# Patient Record
Sex: Male | Born: 1975 | Race: Asian | Hispanic: No | Marital: Single | Smoking: Never smoker
Health system: Southern US, Community
[De-identification: ages and names within clinical notes are randomized; demographics above are authoritative.]

---

## 2006-06-21 ENCOUNTER — Emergency Department (HOSPITAL_COMMUNITY): Admission: EM | Admit: 2006-06-21 | Discharge: 2006-06-21 | Payer: Self-pay | Admitting: Family Medicine

## 2006-08-12 ENCOUNTER — Emergency Department (HOSPITAL_COMMUNITY): Admission: EM | Admit: 2006-08-12 | Discharge: 2006-08-12 | Payer: Self-pay | Admitting: Family Medicine

## 2006-09-30 ENCOUNTER — Ambulatory Visit: Payer: Self-pay | Admitting: Internal Medicine

## 2009-05-22 ENCOUNTER — Encounter (INDEPENDENT_AMBULATORY_CARE_PROVIDER_SITE_OTHER): Payer: Self-pay | Admitting: Internal Medicine

## 2010-10-03 ENCOUNTER — Emergency Department (HOSPITAL_COMMUNITY)
Admission: EM | Admit: 2010-10-03 | Discharge: 2010-10-03 | Payer: Self-pay | Source: Home / Self Care | Admitting: Emergency Medicine

## 2018-12-28 ENCOUNTER — Emergency Department (HOSPITAL_COMMUNITY)
Admission: EM | Admit: 2018-12-28 | Discharge: 2018-12-28 | Disposition: A | Discharge status: Home / Self Care | Payer: Medicaid Other | Attending: Emergency Medicine | Admitting: Emergency Medicine

## 2018-12-28 ENCOUNTER — Emergency Department (HOSPITAL_COMMUNITY): Payer: Medicaid Other

## 2018-12-28 ENCOUNTER — Other Ambulatory Visit: Payer: Self-pay

## 2018-12-28 ENCOUNTER — Encounter (HOSPITAL_COMMUNITY): Payer: Self-pay | Admitting: Emergency Medicine

## 2018-12-28 DIAGNOSIS — J3489 Other specified disorders of nose and nasal sinuses: Secondary | ICD-10-CM | POA: Insufficient documentation

## 2018-12-28 DIAGNOSIS — R0602 Shortness of breath: Secondary | ICD-10-CM | POA: Diagnosis not present

## 2018-12-28 DIAGNOSIS — M791 Myalgia, unspecified site: Secondary | ICD-10-CM | POA: Insufficient documentation

## 2018-12-28 DIAGNOSIS — R1110 Vomiting, unspecified: Secondary | ICD-10-CM | POA: Diagnosis not present

## 2018-12-28 DIAGNOSIS — R509 Fever, unspecified: Secondary | ICD-10-CM | POA: Diagnosis not present

## 2018-12-28 DIAGNOSIS — Z20828 Contact with and (suspected) exposure to other viral communicable diseases: Secondary | ICD-10-CM | POA: Insufficient documentation

## 2018-12-28 DIAGNOSIS — Z20822 Contact with and (suspected) exposure to covid-19: Secondary | ICD-10-CM

## 2018-12-28 DIAGNOSIS — R630 Anorexia: Secondary | ICD-10-CM | POA: Diagnosis not present

## 2018-12-28 DIAGNOSIS — R05 Cough: Secondary | ICD-10-CM | POA: Diagnosis present

## 2018-12-28 DIAGNOSIS — R6889 Other general symptoms and signs: Secondary | ICD-10-CM

## 2018-12-28 LAB — BASIC METABOLIC PANEL
Anion gap: 8 (ref 5–15)
BUN: 13 mg/dL (ref 6–20)
CO2: 25 mmol/L (ref 22–32)
Calcium: 8.5 mg/dL — ABNORMAL LOW (ref 8.9–10.3)
Chloride: 106 mmol/L (ref 98–111)
Creatinine, Ser: 0.65 mg/dL (ref 0.61–1.24)
GFR calc Af Amer: >60 mL/min (ref >60)
GFR calc non Af Amer: >60 mL/min (ref >60)
Glucose, Bld: 92 mg/dL (ref 70–99)
Potassium: 3.8 mmol/L (ref 3.5–5.1)
Sodium: 139 mmol/L (ref 135–145)

## 2018-12-28 LAB — CBC
HCT: 41.7 % (ref 39.0–52.0)
Hemoglobin: 13.5 g/dL (ref 13.0–17.0)
MCH: 28.2 pg (ref 26.0–34.0)
MCHC: 32.4 g/dL (ref 30.0–36.0)
MCV: 87.1 fL (ref 80.0–100.0)
Platelets: 158 10*3/uL (ref 150–400)
RBC: 4.79 MIL/uL (ref 4.22–5.81)
RDW: 12.2 % (ref 11.5–15.5)
WBC: 4.9 10*3/uL (ref 4.0–10.5)
nRBC: 0 % (ref 0.0–0.2)

## 2018-12-28 MED ORDER — SODIUM CHLORIDE 0.9 % IV BOLUS (SEPSIS)
1000.0000 mL | Freq: Once | INTRAVENOUS | Status: AC
  Administered 2018-12-28: 1000 mL via INTRAVENOUS

## 2018-12-28 MED ORDER — BENZONATATE 100 MG PO CAPS: 100.0000 mg | ORAL_CAPSULE | Freq: Three times a day (TID) | ORAL | 0 refills | Status: AC

## 2018-12-28 MED ORDER — SODIUM CHLORIDE 0.9 % IV SOLN
1000.0000 mL | INTRAVENOUS | Status: DC
Start: 2018-12-28 — End: 2018-12-29

## 2018-12-28 NOTE — ED Notes (Signed)
[employer] reported that EMS was called to one of the addresses on file and is sending a unit out to see if a caregiver is available for the pt.

## 2018-12-28 NOTE — ED Notes (Signed)
Pt reported not having a way to get home. He said he doesn't know any phone numbers and does not have a cell phone with numbers to reference. I attempted to call both numbers on file for contacts and no one answered and I was unable to leave messages. Pt instructed on how to use the bus or taxi service to get home.

## 2018-12-28 NOTE — ED Notes (Signed)
Bed: PH15 Expected date:  Expected time:  Means of arrival:  Comments: EMS 42yo cough SHOB family r/o covid

## 2018-12-28 NOTE — ED Notes (Signed)
Pt given and verbalized understanding of d/c instructions and need for follow up with pcp. Told to self isolate for 2 weeks and return if s/s worsen. No further distress or questions at time of ambulation out of department.   Interpreter used for d/c instructions: [employer] [phone]

## 2018-12-28 NOTE — ED Notes (Signed)
Family present to pick patient up. D/c instructions re-verbalized to nephew that takes care of the pt, caregiver verbalized understanding of need to quarantine and monitor condition at home. Told to return if s/s worsen. No further distress or questions upon discharge with family.

## 2018-12-28 NOTE — ED Notes (Signed)
[employer] being contacted to look for family/caregiver for pt. Will go to house and assess situation to try and get pt home to the correct address.

## 2018-12-28 NOTE — ED Provider Notes (Signed)
[facility]ENCY [facility]PT Provider Note   CSN: [id number] Arrival date & time: 12/28/18  1643   Bradley Warner interpreter History   Chief Complaint Chief Complaint  Patient presents with  . Shortness of Breath  . Fever    HPI Bradley Warner is a 43 y.o. male.     HPI Sx started a week ago.  Pt has been coughing. Bringing up some mucus.   Pt did not have a thermometer but he felt feverish.   Feels short of breath. He has had a runny nose, body aches and appetite has been affected.   Food is tasteless and he has not been able to smell much.  He did vomit once, his stools have been loose but he takes stool softeners.   He has been taking tylenol and B1.  History reviewed. No pertinent past medical history.  There are no active problems to display for this patient.   History reviewed. No pertinent surgical history.      Home Medications    Prior to Admission medications   Not on File    Family History No family history on file.  Social History Social History   Tobacco Use  . Smoking status: Not on file  Substance Use Topics  . Alcohol use: Not on file  . Drug use: Not on file     Allergies   Patient has no allergy information on record.   Review of Systems Review of Systems  All other systems reviewed and are negative.    Physical Exam Updated Vital Signs BP 119/81   Pulse (!) 103   Temp 99.6 F (37.6 C) (Oral)   Resp (!) 26   SpO2 99%   Physical Exam Vitals signs and nursing note reviewed.  Constitutional:      General: He is not in acute distress.    Appearance: He is well-developed.  HENT:     Head: Normocephalic and atraumatic.     Right Ear: External ear normal.     Left Ear: External ear normal.  Eyes:     General: No scleral icterus.       Right eye: No discharge.        Left eye: No discharge.     Conjunctiva/sclera: Conjunctivae normal.  Neck:     Musculoskeletal: Neck supple.     Trachea: No tracheal deviation.   Cardiovascular:     Rate and Rhythm: Normal rate and regular rhythm.  Pulmonary:     Effort: Pulmonary effort is normal. No respiratory distress.     Breath sounds: No stridor. Examination of the right-lower field reveals rales. Examination of the left-lower field reveals rales. Rales present. No wheezing.  Abdominal:     General: Bowel sounds are normal. There is no distension.     Palpations: Abdomen is soft.     Tenderness: There is no abdominal tenderness. There is no guarding or rebound.  Musculoskeletal:        General: No tenderness.  Skin:    General: Skin is warm and dry.     Findings: No rash.  Neurological:     Mental Status: He is alert.     Cranial Nerves: No cranial nerve deficit (no facial droop, extraocular movements intact, no slurred speech).     Sensory: No sensory deficit.     Motor: No abnormal muscle tone or seizure activity.     Coordination: Coordination normal.      ED Treatments / Results  Labs (all labs  ordered are listed, but only abnormal results are displayed) Labs Reviewed  BASIC METABOLIC PANEL - Abnormal; Notable for the following components:      Result Value   Calcium 8.5 (*)    All other components within normal limits  CBC    EKG None  Radiology Dg Chest Portable 1 View  Result Date: 12/28/2018 CLINICAL DATA:  Shortness of breath with fever and cough for 1 week. COVID-19 exposure. EXAM: PORTABLE CHEST 1 VIEW COMPARISON:  None. FINDINGS: 1713 hours. There are low lung volumes. Allowing for this, the heart size and mediastinal contours are normal. There are patchy ground-glass and airspace opacities at both lung bases. There is no confluent airspace opacity, pneumothorax or significant pleural effusion. IMPRESSION: Patchy bibasilar airspace and ground-glass opacities are nonspecific, but suspicious for atypical infection, including possible viral pneumonia. Appropriate clinical evaluation recommended. Electronically Signed   By: Carey BullocksWilliam   Veazey M.D.   On: 12/28/2018 18:12    Procedures Procedures (including critical care time)  Medications Ordered in ED Medications  sodium chloride 0.9 % bolus 1,000 mL (1,000 mLs Intravenous New Bag/Given 12/28/18 1758)    Followed by  0.9 %  sodium chloride infusion (has no administration in time range)      Initial Impression / Assessment and Plan / ED Course  I have reviewed the triage vital signs and the nursing notes.  Pertinent labs & imaging results that were available during my care of the patient were reviewed by me and considered in my medical decision making (see chart for details).  Clinical Course as of Dec 27 1917  Thu Dec 28, 2018  1829 Chest x-ray findings suggestive of viral pneumonia.   [JK]    Clinical Course User Index [JK] Linwood DibblesKnapp, Soul Hackman, MD      Bradley Warner was evaluated in Emergency Department on 12/28/2018 for the symptoms described in the history of present illness. He was evaluated in the context of the global COVID-19 pandemic, which necessitated consideration that the patient might be at risk for infection with the SARS-CoV-2 virus that causes COVID-19. Institutional protocols and algorithms that pertain to the evaluation of patients at risk for COVID-19 are in a state of rapid change based on information released by regulatory bodies including the CDC and federal and state organizations. These policies and algorithms were followed during the patient's care in the ED.  Patient symptoms are suggestive of COVID-19 virus infection.  He has the typical anosmia and dysgeusia.  Patient does have findings suggestive of a viral pneumonia on x-ray.  Patient fortunately is not hypoxic.  He is breathing easily without any respiratory distress.  Patient appears stable to continue outpatient management.  Supportive measures.  Warning signs precautions discussed.  Translator was used during the patient's evaluation and during the discharge process.  Final Clinical  Impressions(s) / ED Diagnoses   Final diagnoses:  Suspected Covid-19 Virus Infection    ED Discharge Orders    None       Linwood DibblesKnapp, Derren Suydam, MD 12/28/18 Jerene Bears1920

## 2018-12-28 NOTE — ED Triage Notes (Signed)
Patient complains of shortness of breath, fever and a cough for one week. He has been around a family member who tested positive for Covid-19.    EMS vitals:  118/90 BP 110 HR 22 Resp Rate 97% O2 sats on room air.

## 2018-12-28 NOTE — Discharge Instructions (Signed)
Follow up with a primary care doctor, call to schedule a tele health visit

## 2018-12-28 NOTE — ED Notes (Signed)
[facility] made contact with family and reports they are coming to get the pt and take him home.

## 2018-12-28 NOTE — Progress Notes (Signed)
CSW contacted by [employer] nurse regarding identifying and talking with family. CSW notes two prior addresses and noted patient's landline is disconnected. CSW is working on attempting to reach patient's family.  Tenna Delaine, LCSW, LCAS-A Clinical Social Worker II [phone]

## 2020-03-22 ENCOUNTER — Ambulatory Visit: Payer: Medicaid Other | Attending: Internal Medicine

## 2020-03-22 ENCOUNTER — Other Ambulatory Visit: Payer: Self-pay

## 2020-03-22 DIAGNOSIS — Z23 Encounter for immunization: Secondary | ICD-10-CM

## 2020-03-22 NOTE — Progress Notes (Signed)
   Covid-19 Vaccination Clinic  Name:  Challen Spainhour    MRN: [id number] DOB: [birth year]  03/22/2020  Mr. Mccall was observed post Covid-19 immunization for 15 minutes without incident. He was provided with Vaccine Information Sheet and instruction to access the V-Safe system.   Mr. Reali was instructed to call 911 with any severe reactions post vaccine: Marland Kitchen Difficulty breathing  . Swelling of face and throat  . A fast heartbeat  . A bad rash all over body  . Dizziness and weakness   Immunizations Administered    Name Date Dose VIS Date Route   Pfizer COVID-19 Vaccine 03/22/2020 12:05 PM 0.3 mL 10/31/2018 Intramuscular   Manufacturer: [employer], [employer]   Lot: [id number]   NDC: [address]-5

## 2020-05-29 ENCOUNTER — Ambulatory Visit (HOSPITAL_COMMUNITY)
Admission: EM | Admit: 2020-05-29 | Discharge: 2020-05-29 | Disposition: A | Discharge status: Home / Self Care | Payer: Medicaid Other | Attending: Family Medicine | Admitting: Family Medicine

## 2020-05-29 ENCOUNTER — Other Ambulatory Visit: Payer: Self-pay

## 2020-05-29 ENCOUNTER — Encounter (HOSPITAL_COMMUNITY): Payer: Self-pay

## 2020-05-29 DIAGNOSIS — R21 Rash and other nonspecific skin eruption: Secondary | ICD-10-CM

## 2020-05-29 MED ORDER — CLOTRIMAZOLE 1 % EX CREA: TOPICAL_CREAM | CUTANEOUS | 0 refills | Status: AC

## 2020-05-29 NOTE — ED Triage Notes (Signed)
Pt c/o itching red rash to groin and testicular area for approx 1 week. Denies dysuria sx, no OTC meds used.

## 2020-05-29 NOTE — Discharge Instructions (Signed)
I suspect that your rash is due to a yeast infection so I have sent over some cream to help treat this. Keep the area clean and dry and follow up if the rash is worsening or not resolving.

## 2020-05-29 NOTE — ED Provider Notes (Signed)
[facility]    CSN: [id number] Arrival date & time: 05/29/20  0820      History   Chief Complaint Chief Complaint  Patient presents with  . Rash    HPI Bradley Warner is a 44 y.o. male.   Here today with his son who he requests to use as a Nurse, learning disability for today's visit. He states he's had an itchy red rash on tip of penis for about a week now, which has happened intermittently in the past but he's never sought care for it. No new soaps or laundry products, no exposures to STIs, and denies dysuria, hematuria, abdominal pain, fever, rashes elsewhere. Not trying anything OTC for sxs.      History reviewed. No pertinent past medical history.  There are no problems to display for this patient.   History reviewed. No pertinent surgical history.     Home Medications    Prior to Admission medications   Medication Sig Start Date End Date Taking? Authorizing Provider  benzonatate (TESSALON) 100 MG capsule Take 1 capsule (100 mg total) by mouth every 8 (eight) hours. 12/28/18   Linwood Dibbles, MD  clotrimazole (LOTRIMIN) 1 % cream Apply to affected area 2 times daily as needed 05/29/20   Particia Nearing, PA-C    Family History Family History  Family history unknown: Yes    Social History Social History   Tobacco Use  . Smoking status: Never Smoker  . Smokeless tobacco: Never Used  Vaping Use  . Vaping Use: Never used  Substance Use Topics  . Alcohol use: Never  . Drug use: Never     Allergies   Patient has no known allergies.   Review of Systems Review of Systems PER HPI    Physical Exam Triage Vital Signs ED Triage Vitals [05/29/20 0912]  Enc Vitals Group     BP 117/75     Pulse Rate 96     Resp 18     Temp 98.5 F (36.9 C)     Temp Source Oral     SpO2 98 %     Weight      Height      Head Circumference      Peak Flow      Pain Score      Pain Loc      Pain Edu?      Excl. in GC?    No data found.  Updated Vital Signs BP 117/75  (BP Location: Left Arm)   Pulse 96   Temp 98.5 F (36.9 C) (Oral)   Resp 18   SpO2 98%   Visual Acuity Right Eye Distance:   Left Eye Distance:   Bilateral Distance:    Right Eye Near:   Left Eye Near:    Bilateral Near:     Physical Exam Vitals and nursing note reviewed. Exam conducted with a chaperone present.  Constitutional:      Appearance: Normal appearance.  HENT:     Head: Atraumatic.  Eyes:     Extraocular Movements: Extraocular movements intact.     Conjunctiva/sclera: Conjunctivae normal.  Cardiovascular:     Rate and Rhythm: Normal rate and regular rhythm.  Pulmonary:     Effort: Pulmonary effort is normal.     Breath sounds: Normal breath sounds.  Abdominal:     General: Bowel sounds are normal. There is no distension.     Palpations: Abdomen is soft.     Tenderness: There is no abdominal  tenderness. There is no right CVA tenderness, left CVA tenderness or guarding.  Genitourinary:   Musculoskeletal:        General: Normal range of motion.     Cervical back: Normal range of motion and neck supple.  Lymphadenopathy:     Lower Body: No right inguinal adenopathy. No left inguinal adenopathy.  Skin:    General: Skin is warm and dry.  Neurological:     General: No focal deficit present.     Mental Status: He is oriented to person, place, and time.  Psychiatric:        Mood and Affect: Mood normal.        Thought Content: Thought content normal.        Judgment: Judgment normal.      UC Treatments / Results  Labs (all labs ordered are listed, but only abnormal results are displayed) Labs Reviewed - No data to display  EKG   Radiology No results found.  Procedures Procedures (including critical care time)  Medications Ordered in UC Medications - No data to display  Initial Impression / Assessment and Plan / UC Course  I have reviewed the triage vital signs and the nursing notes.  Pertinent labs & imaging results that were available  during my care of the patient were reviewed by me and considered in my medical decision making (see chart for details).     Consistent with yeast dermatitis. Discussed good hygiene regimen, clotrimazole cream prn. He declines STI testing today, and no urinary sxs concerning for UTI/prostatitis/epididymitis. F/u if sxs worsening or not resolving.   Final Clinical Impressions(s) / UC Diagnoses   Final diagnoses:  Rash     Discharge Instructions     I suspect that your rash is due to a yeast infection so I have sent over some cream to help treat this. Keep the area clean and dry and follow up if the rash is worsening or not resolving.      ED Prescriptions    Medication Sig Dispense Auth. Provider   clotrimazole (LOTRIMIN) 1 % cream Apply to affected area 2 times daily as needed 30 g Particia Nearing, PA-C     PDMP not reviewed this encounter.   Roosvelt Maser [place], [place] 05/29/20 [phone]

## 2020-09-12 ENCOUNTER — Other Ambulatory Visit: Payer: Self-pay

## 2020-09-12 ENCOUNTER — Encounter (HOSPITAL_COMMUNITY): Payer: Self-pay | Admitting: *Deleted

## 2020-09-12 ENCOUNTER — Ambulatory Visit (HOSPITAL_COMMUNITY)
Admission: EM | Admit: 2020-09-12 | Discharge: 2020-09-12 | Disposition: A | Discharge status: Home / Self Care | Payer: Medicaid Other | Attending: Emergency Medicine | Admitting: Emergency Medicine

## 2020-09-12 DIAGNOSIS — U071 COVID-19: Secondary | ICD-10-CM | POA: Insufficient documentation

## 2020-09-12 DIAGNOSIS — J069 Acute upper respiratory infection, unspecified: Secondary | ICD-10-CM | POA: Diagnosis present

## 2020-09-12 LAB — SARS CORONAVIRUS 2 (TAT 6-24 HRS): SARS Coronavirus 2: POSITIVE — AB

## 2020-09-12 NOTE — ED Triage Notes (Signed)
Pt reports cough ,sore throat since Monday with no known contacts.

## 2020-09-12 NOTE — Discharge Instructions (Addendum)
Push fluids to ensure adequate hydration and keep secretions thin.  °Tylenol and/or ibuprofen as needed for pain or fevers.  °Throat lozenges, gargles, chloraseptic spray, warm teas, popsicles etc to help with throat pain.   °Over the counter medications as needed for symptoms.  °Self isolate until covid results are back.  °Will notify you by phone of any positive findings. Your negative results will be sent through your MyChart.     °

## 2020-09-12 NOTE — ED Provider Notes (Signed)
[facility]    CSN: [id number] Arrival date & time: 09/12/20  0857      History   Chief Complaint Chief Complaint  Patient presents with  . Sore Throat  . Nasal Congestion    HPI Bradley Warner is a 45 y.o. male.   Bradley Warner presents with his nephew with complaints of cough, congestion, sore throat, which started 4 days ago. No fevers, no headache or body aches. Mild nausea. No vomiting or diarrhea. He has had covid in the past and has been vaccinated for covid-19. Hasn't taken any medications for symptoms. No known ill contacts. Doesn't work outside of the home.   Nephew provides interpretation per patient request.   ROS per HPI, negative if not otherwise mentioned.      History reviewed. No pertinent past medical history.  There are no problems to display for this patient.   History reviewed. No pertinent surgical history.     Home Medications    Prior to Admission medications   Medication Sig Start Date End Date Taking? Authorizing Provider  benzonatate (TESSALON) 100 MG capsule Take 1 capsule (100 mg total) by mouth every 8 (eight) hours. 12/28/18   Linwood Dibbles, MD  clotrimazole (LOTRIMIN) 1 % cream Apply to affected area 2 times daily as needed 05/29/20   Particia Nearing, PA-C    Family History Family History  Family history unknown: Yes    Social History Social History   Tobacco Use  . Smoking status: Never Smoker  . Smokeless tobacco: Never Used  Vaping Use  . Vaping Use: Never used  Substance Use Topics  . Alcohol use: Never  . Drug use: Never     Allergies   Patient has no known allergies.   Review of Systems Review of Systems   Physical Exam Triage Vital Signs ED Triage Vitals  Enc Vitals Group     BP 09/12/20 1039 124/84     Pulse Rate 09/12/20 1039 (!) 105     Resp 09/12/20 1039 18     Temp 09/12/20 1039 98.1 F (36.7 C)     Temp Source 09/12/20 1039 Oral     SpO2 09/12/20 1039 100 %     Weight --      Height --       Head Circumference --      Peak Flow --      Pain Score 09/12/20 1041 6     Pain Loc --      Pain Edu? --      Excl. in GC? --    No data found.  Updated Vital Signs BP 124/84 (BP Location: Right Arm)   Pulse (!) 105   Temp 98.1 F (36.7 C) (Oral)   Resp 18   SpO2 100%   Visual Acuity Right Eye Distance:   Left Eye Distance:   Bilateral Distance:    Right Eye Near:   Left Eye Near:    Bilateral Near:     Physical Exam Constitutional:      Appearance: He is well-developed.  HENT:     Mouth/Throat:     Mouth: Mucous membranes are moist.     Pharynx: No pharyngeal swelling.     Tonsils: Tonsillar exudate present. 1+ on the right. 1+ on the left.  Cardiovascular:     Rate and Rhythm: Normal rate.  Pulmonary:     Effort: Pulmonary effort is normal.  Lymphadenopathy:     Cervical: No cervical adenopathy.  Skin:  General: Skin is warm and dry.  Neurological:     Mental Status: He is alert and oriented to person, place, and time.      UC Treatments / Results  Labs (all labs ordered are listed, but only abnormal results are displayed) Labs Reviewed  SARS CORONAVIRUS 2 (TAT 6-24 HRS)    EKG   Radiology No results found.  Procedures Procedures (including critical care time)  Medications Ordered in UC Medications - No data to display  Initial Impression / Assessment and Plan / UC Course  I have reviewed the triage vital signs and the nursing notes.  Pertinent labs & imaging results that were available during my care of the patient were reviewed by me and considered in my medical decision making (see chart for details).     Non toxic. Benign physical exam.  History and physical consistent with viral illness.  Covid testing pending and isolation instructions provided.  Supportive cares recommended. Return precautions provided. Patient and family verbalized understanding and agreeable to plan.   Final Clinical Impressions(s) / UC Diagnoses    Final diagnoses:  Upper respiratory tract infection, unspecified type     Discharge Instructions     Push fluids to ensure adequate hydration and keep secretions thin.  Tylenol and/or ibuprofen as needed for pain or fevers.  Throat lozenges, gargles, chloraseptic spray, warm teas, popsicles etc to help with throat pain.   Over the counter medications as needed for symptoms.  Self isolate until covid results are back.  Will notify you by phone of any positive findings. Your negative results will be sent through your MyChart.        ED Prescriptions    None     PDMP not reviewed this encounter.   Georgetta Haber, NP 09/12/20 1112

## 2020-10-07 IMAGING — DX PORTABLE CHEST - 1 VIEW
1 series · 1 of 1 positions shown · non-contrast
Comparison: None.

CLINICAL DATA: Shortness of breath with fever and cough for 1 week.
FDTI5-CM exposure.

EXAM:
PORTABLE CHEST 1 VIEW

[chest ap]
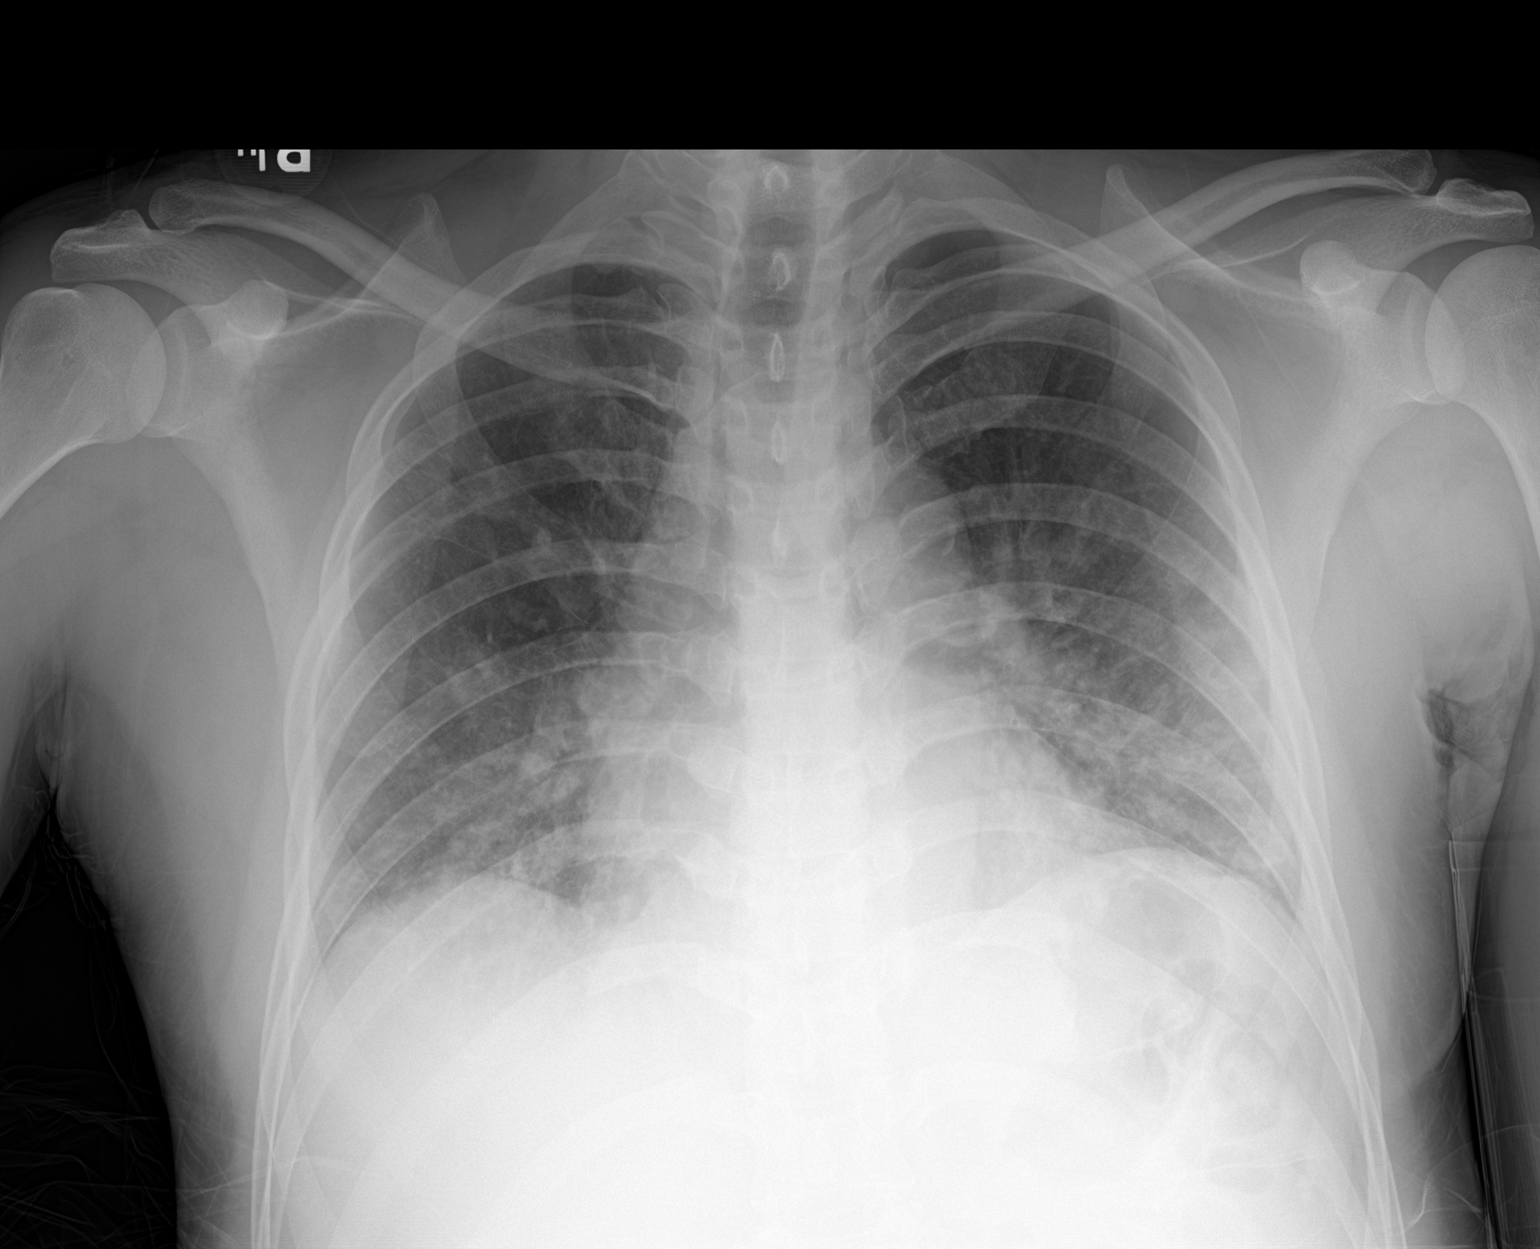

[1 of 1 positions shown; findings below may reference images not displayed]

FINDINGS: 1616 hours. There are low lung volumes. Allowing for this, the heart
size and mediastinal contours are normal. There are patchy
ground-glass and airspace opacities at both lung bases. There is no
confluent airspace opacity, pneumothorax or significant pleural
effusion.
IMPRESSION: Patchy bibasilar airspace and ground-glass opacities are
nonspecific, but suspicious for atypical infection, including
possible viral pneumonia. Appropriate clinical evaluation
recommended.
